# Patient Record
Sex: Female | Born: 1995 | Race: White | Hispanic: No | Marital: Single | Smoking: Never smoker
Health system: Southern US, Community
[De-identification: ages and names within clinical notes are randomized; demographics above are authoritative.]

## PROBLEM LIST (undated history)

## (undated) HISTORY — PX: TONSILLECTOMY: SUR1361

---

## 2017-04-21 ENCOUNTER — Encounter: Payer: Self-pay | Admitting: Emergency Medicine

## 2017-04-21 ENCOUNTER — Emergency Department
Admission: EM | Admit: 2017-04-21 | Discharge: 2017-04-21 | Disposition: A | Discharge status: Home / Self Care | Payer: No Typology Code available for payment source | Attending: Student in an Organized Health Care Education/Training Program | Admitting: Student in an Organized Health Care Education/Training Program

## 2017-04-21 ENCOUNTER — Other Ambulatory Visit: Payer: Self-pay

## 2017-04-21 ENCOUNTER — Emergency Department: Payer: No Typology Code available for payment source

## 2017-04-21 DIAGNOSIS — S39012A Strain of muscle, fascia and tendon of lower back, initial encounter: Secondary | ICD-10-CM

## 2017-04-21 DIAGNOSIS — S3992XA Unspecified injury of lower back, initial encounter: Secondary | ICD-10-CM | POA: Diagnosis present

## 2017-04-21 DIAGNOSIS — Y929 Unspecified place or not applicable: Secondary | ICD-10-CM | POA: Diagnosis not present

## 2017-04-21 DIAGNOSIS — Z79899 Other long term (current) drug therapy: Secondary | ICD-10-CM | POA: Insufficient documentation

## 2017-04-21 DIAGNOSIS — X58XXXA Exposure to other specified factors, initial encounter: Secondary | ICD-10-CM | POA: Insufficient documentation

## 2017-04-21 DIAGNOSIS — Y999 Unspecified external cause status: Secondary | ICD-10-CM | POA: Diagnosis not present

## 2017-04-21 DIAGNOSIS — Y939 Activity, unspecified: Secondary | ICD-10-CM | POA: Diagnosis not present

## 2017-04-21 LAB — POCT PREGNANCY, URINE: Preg Test, Ur: NEGATIVE

## 2017-04-21 LAB — URINALYSIS, COMPLETE (UACMP) WITH MICROSCOPIC
BILIRUBIN URINE: NEGATIVE
Glucose, UA: NEGATIVE mg/dL
Hgb urine dipstick: NEGATIVE
KETONES UR: NEGATIVE mg/dL
LEUKOCYTES UA: NEGATIVE
Nitrite: NEGATIVE
PROTEIN: NEGATIVE mg/dL
RBC / HPF: NONE SEEN RBC/hpf (ref 0–5)
Specific Gravity, Urine: 1.017 (ref 1.005–1.030)
pH: 6 (ref 5.0–8.0)

## 2017-04-21 MED ORDER — BACLOFEN 10 MG PO TABS: 10.0000 mg | ORAL_TABLET | Freq: Every day | ORAL | 1 refills | Status: AC

## 2017-04-21 MED ORDER — IBUPROFEN 800 MG PO TABS: 800.0000 mg | ORAL_TABLET | Freq: Three times a day (TID) | ORAL | 0 refills | Status: AC | PRN

## 2017-04-21 NOTE — Discharge Instructions (Signed)
Apply wet heat followed by ice to your lower back.  Take medication as prescribed.  Follow-up with orthopedics if you are not better in a week.  He would need to schedule appointment.  You can also see a chiropractor, Dr. Alfredo Bachecil is who I would recommend in the area.

## 2017-04-21 NOTE — ED Notes (Signed)
Pt ambulatory to toilet to collect urine sample.  

## 2017-04-21 NOTE — ED Provider Notes (Signed)
[facility] Emergency Department Provider Note  ____________________________________________   First MD Initiated Contact with Patient 04/21/17 2018     (approximate)  I have reviewed the triage vital signs and the nursing notes.   HISTORY  Chief Complaint Back Pain    HPI Krystal Klein is a 22 y.o. female presents emergency department complaining of low back pain for several months.  She states she thought she was needed to stretch but the pain is gotten worse and is radiating into her she is used over-the-counter medication without relief.  She had a injury when she played volleyball in high school and was told it was a muscle strain.  She has not had any problems since that injury.  She denies any urinary symptoms.  She denies any new injury  History reviewed. No pertinent past medical history.  There are no active problems to display for this patient.   Past Surgical History:  Procedure Laterality Date  . TONSILLECTOMY      Prior to Admission medications   Medication Sig Start Date End Date Taking? Authorizing Provider  baclofen (LIORESAL) 10 MG tablet Take 1 tablet (10 mg total) by mouth daily. 04/21/17 04/21/18  Conn Trombetta, Roselyn Bering, PA-C  ibuprofen (ADVIL,MOTRIN) 800 MG tablet Take 1 tablet (800 mg total) by mouth every 8 (eight) hours as needed. 04/21/17   Faythe Ghee, PA-C    Allergies Patient has no known allergies.  No family history on file.  Social History Social History   Tobacco Use  . Smoking status: Never Smoker  . Smokeless tobacco: Never Used  Substance Use Topics  . Alcohol use: Not on file  . Drug use: Not on file    Review of Systems  Constitutional: No fever/chills Eyes: No visual changes. ENT: No sore throat. Respiratory: Denies cough Genitourinary: Negative for dysuria. Musculoskeletal: Positive for back pain. Skin: Negative for rash.    ____________________________________________   PHYSICAL  EXAM:  VITAL SIGNS: ED Triage Vitals  Enc Vitals Group     BP 04/21/17 2012 (!) 149/88     Pulse Rate 04/21/17 2012 (!) 103     Resp 04/21/17 2012 20     Temp 04/21/17 2012 98 F (36.7 C)     Temp Source 04/21/17 2012 Oral     SpO2 04/21/17 2012 98 %     Weight 04/21/17 2011 164 lb (74.4 kg)     Height 04/21/17 2011 5\' 6"  (1.676 m)     Head Circumference --      Peak Flow --      Pain Score 04/21/17 2011 7     Pain Loc --      Pain Edu? --      Excl. in GC? --     Constitutional: Alert and oriented. Well appearing and in no acute distress. Eyes: Conjunctivae are normal.  Head: Atraumatic. Nose: No congestion/rhinnorhea. Mouth/Throat: Mucous membranes are moist.   Cardiovascular: Normal rate, regular rhythm.  Heart sounds are normal Respiratory: Normal respiratory effort.  No retractions, lungs clear to auscultation GU: deferred Musculoskeletal: FROM all extremities, warm and well perfused, lumbar spine is tender to palpation around L4-L5.  SI joints are tender.  IT band is tender.  Patient has decreased range of motion with forward flexion.  She has increased pain with hyperextension.  Twisting does not reproduce the pain.  She is able to stand on her toes and walk on her heels without any difficulty Neurologic:  Normal speech and  language.  Skin:  Skin is warm, dry and intact. No rash noted. Psychiatric: Mood and affect are normal. Speech and behavior are normal.  ____________________________________________   LABS (all labs ordered are listed, but only abnormal results are displayed)  Labs Reviewed  URINALYSIS, COMPLETE (UACMP) WITH MICROSCOPIC - Abnormal; Notable for the following components:      Result Value   Color, Urine YELLOW (*)    APPearance CLEAR (*)    Bacteria, UA RARE (*)    Squamous Epithelial / LPF 0-5 (*)    All other components within normal limits  POC URINE PREG, ED  POCT PREGNANCY, URINE    ____________________________________________   ____________________________________________  RADIOLOGY  X-ray lumbar spine is negative  ____________________________________________   PROCEDURES  Procedure(s) performed: No  Procedures    ____________________________________________   INITIAL IMPRESSION / ASSESSMENT AND PLAN / ED COURSE  Pertinent labs & imaging results that were available during my care of the patient were reviewed by me and considered in my medical decision making (see chart for details).  Patient is 22 year old female complaining of low back pain.  For several months  On physical exam the lumbar spine is mildly tender.  Pain is reproduced with forward flexion and hyperextension . remainder the exam is benign  X-ray lumbar spine is negative UA is normal, urine pregnant is negative  X-ray and lab results were discussed with patient.  She is given a prescription for ibuprofen 800 mg 3 times a day as needed.  Baclofen 10 mg 3 times daily.  She is to use wet heat followed by ice.  She is to stretch daily.  She is to foam roll to decrease inflammation of the IT band which may be pulling on her back.  She states she understands will comply with our instructions.  She is discharged in stable condition     As part of my medical decision making, I reviewed the following data within the electronic MEDICAL RECORD NUMBER Nursing notes reviewed and incorporated, Radiograph reviewed x-ray lumbar spine is negative, Notes from prior ED visits and [place] Controlled Substance Database  ____________________________________________   FINAL CLINICAL IMPRESSION(S) / ED DIAGNOSES  Final diagnoses:  Strain of lumbar region, initial encounter      NEW MEDICATIONS STARTED DURING THIS VISIT:  Discharge Medication List as of 04/21/2017  9:20 PM    START taking these medications   Details  baclofen (LIORESAL) 10 MG tablet Take 1 tablet (10 mg total) by mouth daily., Starting  Wed 04/21/2017, Until Thu 04/21/2018, Print    ibuprofen (ADVIL,MOTRIN) 800 MG tablet Take 1 tablet (800 mg total) by mouth every 8 (eight) hours as needed., Starting Wed 04/21/2017, Print         Note:  This document was prepared using Dragon voice recognition software and may include unintentional dictation errors.    Faythe GheeFisher, Maxximus Gotay W, PA-C 04/21/17 2219    Willy Eddyobinson, Patrick, MD 04/21/17 2249

## 2017-04-21 NOTE — ED Notes (Signed)
Pt returned from xray

## 2017-04-21 NOTE — ED Triage Notes (Signed)
Patient ambulatory to triage with steady gait, without difficulty or distress noted; pt reports lower back pain x month; st hx of same with volleyball injury

## 2018-12-28 IMAGING — CR DG LUMBAR SPINE COMPLETE 4+V
5 series · 5 of 5 positions shown · non-contrast
Comparison: None.

CLINICAL DATA: Lumbago for 1 month after playing volleyball

EXAM:
LUMBAR SPINE - COMPLETE 4+ VIEW

[l-spine ap]
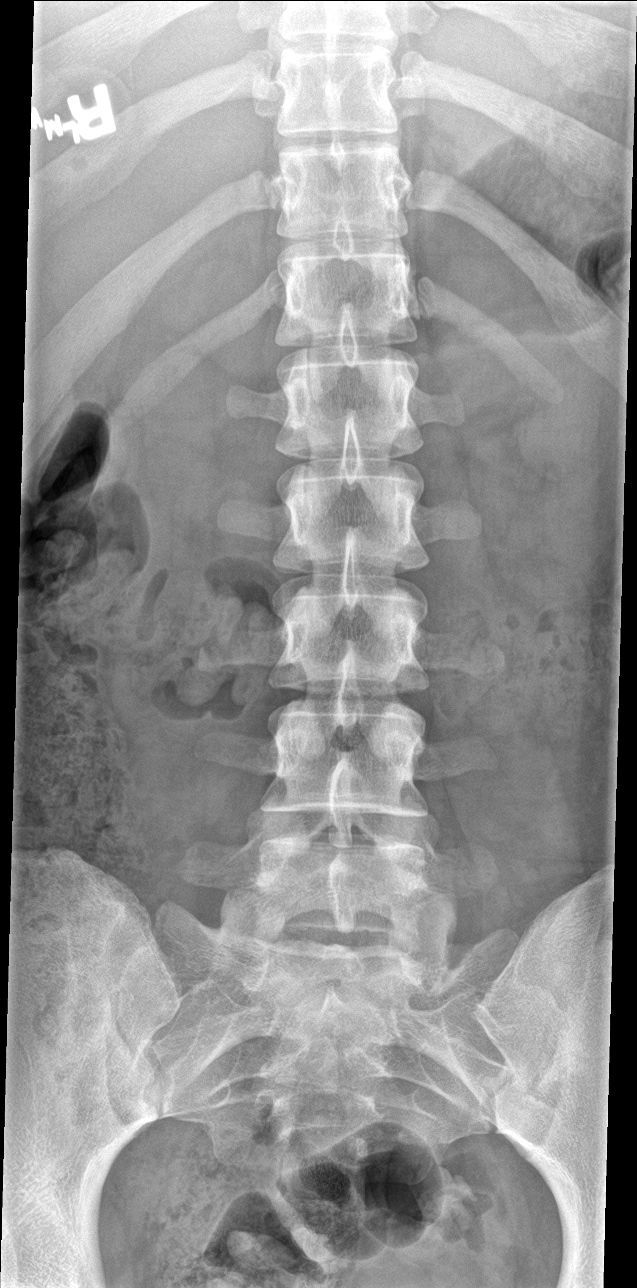

[l-spine obl (1 of 2)]
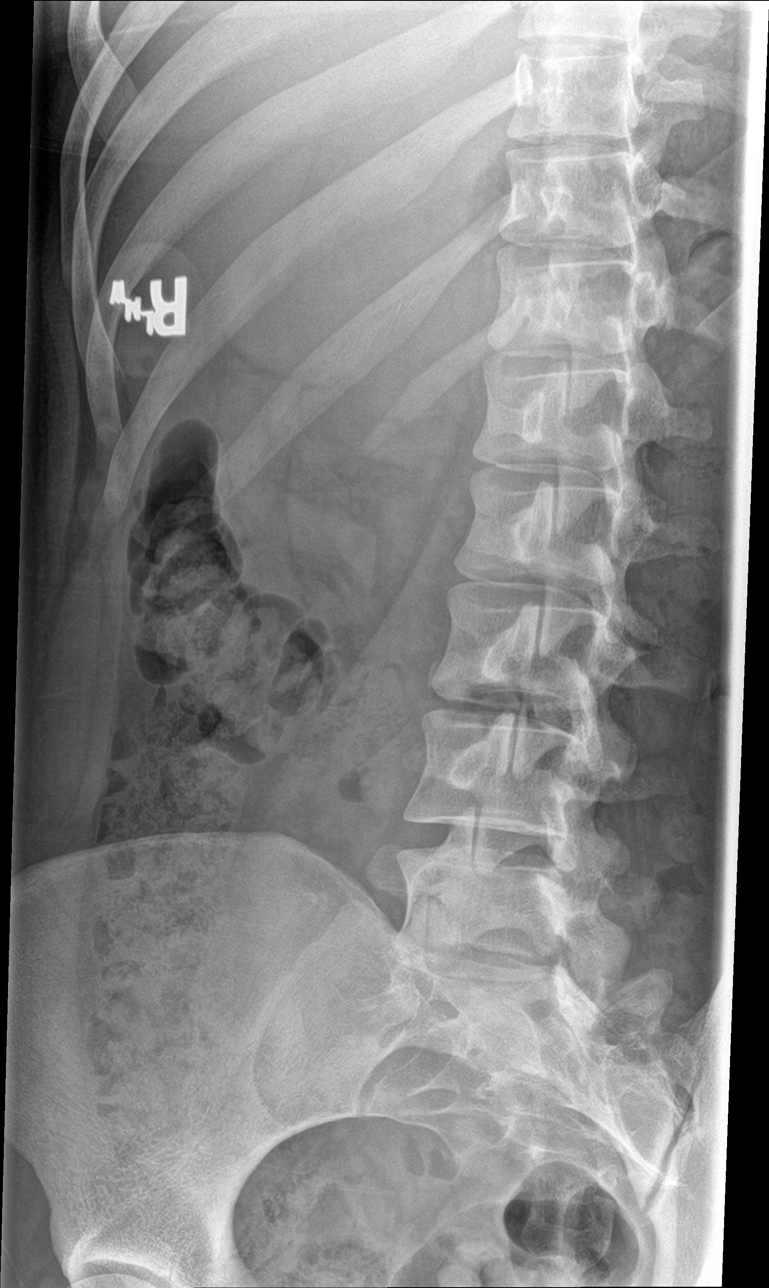

[l-spine obl (2 of 2)]
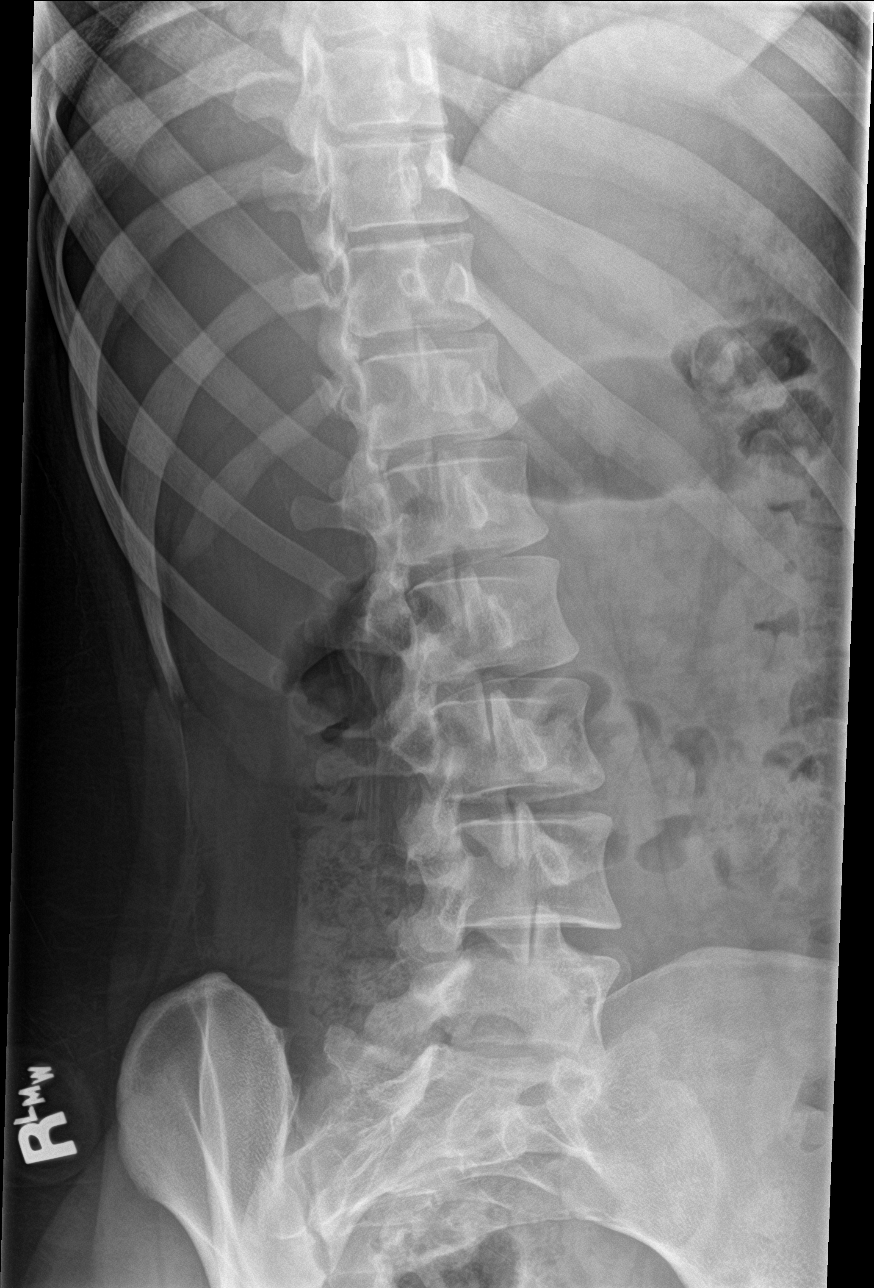

[l-spine lat]
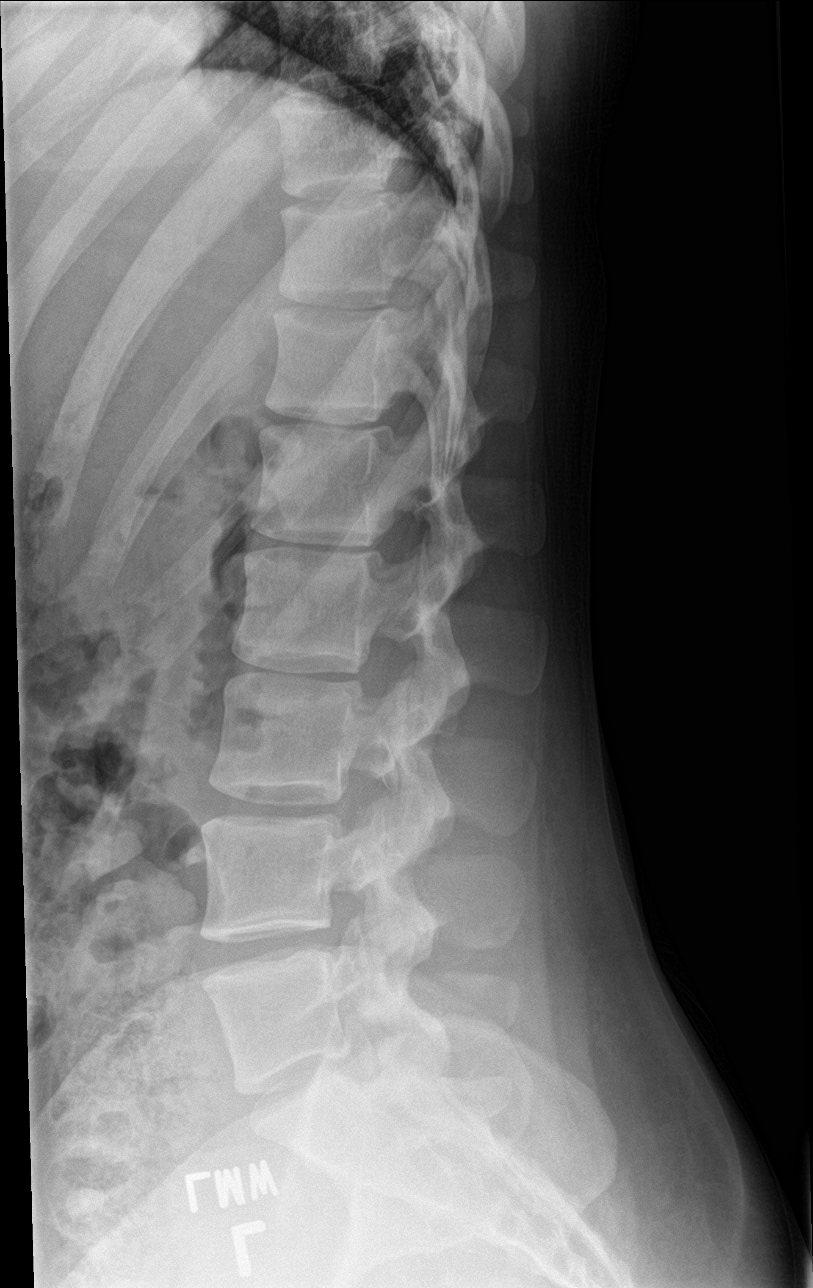

[l-spine spot]
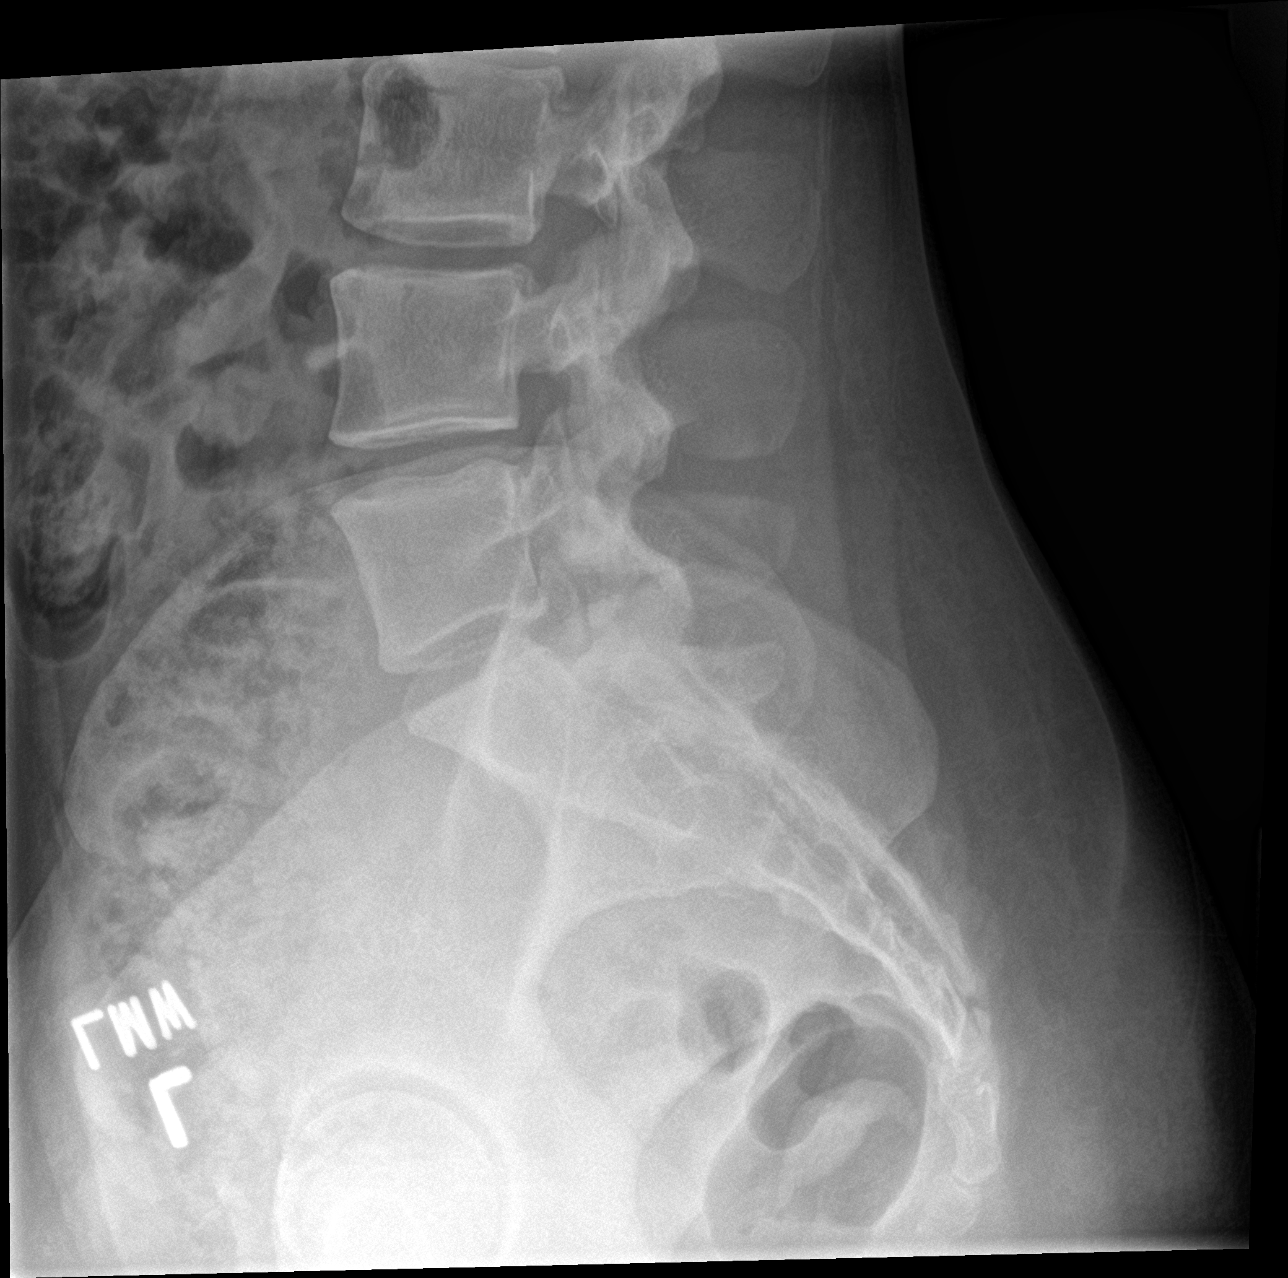

[5 of 5 positions shown; findings below may reference images not displayed]

FINDINGS: Frontal, lateral, spot lumbosacral lateral, and bilateral oblique
views were obtained. There are 5 non-rib-bearing lumbar type
vertebral bodies. There is no fracture or spondylolisthesis. The
disc spaces appear normal. There is no appreciable facet
arthropathy.
IMPRESSION: No fracture or spondylolisthesis.  No evident arthropathy.

## 2019-05-14 ENCOUNTER — Ambulatory Visit: Payer: No Typology Code available for payment source | Attending: Internal Medicine

## 2019-05-14 DIAGNOSIS — Z23 Encounter for immunization: Secondary | ICD-10-CM

## 2019-05-14 NOTE — Progress Notes (Signed)
   Covid-19 Vaccination Clinic  Name:  Krystal Klein    MRN: [id number] DOB: Jul 28, 1995  05/14/2019  [name] was observed post Covid-19 immunization for 15 minutes without incident. She was provided with Vaccine Information Sheet and instruction to access the V-Safe system.   [name] was instructed to call 911 with any severe reactions post vaccine: Marland Kitchen Difficulty breathing  . Swelling of face and throat  . A fast heartbeat  . A bad rash all over body  . Dizziness and weakness   Immunizations Administered    Name Date Dose VIS Date Route   Pfizer COVID-19 Vaccine 05/14/2019  6:53 PM 0.3 mL 02/03/2019 Intramuscular   Manufacturer: [employer], [employer]   Lot: [id number]   NDC: [address]-8

## 2019-06-04 ENCOUNTER — Ambulatory Visit: Payer: No Typology Code available for payment source | Attending: Internal Medicine

## 2019-06-04 DIAGNOSIS — Z23 Encounter for immunization: Secondary | ICD-10-CM

## 2019-06-04 NOTE — Progress Notes (Signed)
   Covid-19 Vaccination Clinic  Name:  Krystal Klein    MRN: [id number] DOB: [birth year]  06/04/2019  [name] was observed post Covid-19 immunization for 15 minutes without incident. She was provided with Vaccine Information Sheet and instruction to access the V-Safe system.   [name] was instructed to call 911 with any severe reactions post vaccine: Marland Kitchen Difficulty breathing  . Swelling of face and throat  . A fast heartbeat  . A bad rash all over body  . Dizziness and weakness   Immunizations Administered    Name Date Dose VIS Date Route   Pfizer COVID-19 Vaccine 06/04/2019  6:09 PM 0.3 mL 02/03/2019 Intramuscular   Manufacturer: [employer], [employer]   Lot: [id number]   NDC: [address]-1
